# Patient Record
Sex: Male | Born: 1995 | Race: White | Hispanic: No | Marital: Single | Smoking: Never smoker
Health system: Southern US, Community
[De-identification: ages and names within clinical notes are randomized; demographics above are authoritative.]

## PROBLEM LIST (undated history)

## (undated) DIAGNOSIS — G40909 Epilepsy, unspecified, not intractable, without status epilepticus: Secondary | ICD-10-CM

---
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
## 2014-08-19 ENCOUNTER — Emergency Department (INDEPENDENT_AMBULATORY_CARE_PROVIDER_SITE_OTHER): Payer: BLUE CROSS/BLUE SHIELD

## 2014-08-19 ENCOUNTER — Encounter: Payer: Self-pay | Admitting: Emergency Medicine

## 2014-08-19 ENCOUNTER — Emergency Department
Admission: EM | Admit: 2014-08-19 | Discharge: 2014-08-19 | Disposition: A | Discharge status: Home / Self Care | Payer: BLUE CROSS/BLUE SHIELD | Source: Home / Self Care | Attending: Emergency Medicine | Admitting: Emergency Medicine

## 2014-08-19 DIAGNOSIS — S63502A Unspecified sprain of left wrist, initial encounter: Secondary | ICD-10-CM | POA: Diagnosis not present

## 2014-08-19 DIAGNOSIS — M25532 Pain in left wrist: Secondary | ICD-10-CM | POA: Diagnosis not present

## 2014-08-19 HISTORY — DX: Epilepsy, unspecified, not intractable, without status epilepticus: G40.909

## 2014-08-19 NOTE — Discharge Instructions (Signed)
Wrist Sprain  with Rehab  A sprain is an injury in which a ligament that maintains the proper alignment of a joint is partially or completely torn. The ligaments of the wrist are susceptible to sprains. Sprains are classified into three categories. Grade 1 sprains cause pain, but the tendon is not lengthened. Grade 2 sprains include a lengthened ligament because the ligament is stretched or partially ruptured. With grade 2 sprains there is still function, although the function may be diminished. Grade 3 sprains are characterized by a complete tear of the tendon or muscle, and function is usually impaired.  SYMPTOMS   · Pain tenderness, inflammation, and/or bruising (contusion) of the injury.  · A "pop" or tear felt and/or heard at the time of injury.  · Decreased wrist function.  CAUSES   A wrist sprain occurs when a force is placed on one or more ligaments that is greater than it/they can withstand. Common mechanisms of injury include:  · Catching a ball with you hands.  · Repetitive and/ or strenuous extension or flexion of the wrist.  RISK INCREASES WITH:  · Previous wrist injury.  · Contact sports (boxing or wrestling).  · Activities in which falling is common.  · Poor strength and flexibility.  · Improperly fitted or padded protective equipment.  PREVENTION  · Warm up and stretch properly before activity.  · Allow for adequate recovery between workouts.  · Maintain physical fitness:  ¨ Strength, flexibility, and endurance.  ¨ Cardiovascular fitness.  · Protect the wrist joint by limiting its motion with the use of taping, braces, or splints.  · Protect the wrist after injury for 6 to 12 months.  PROGNOSIS   The prognosis for wrist sprains depends on the degree of injury. Grade 1 sprains require 2 to 6 weeks of treatment. Grade 2 sprains require 6 to 8 weeks of treatment, and grade 3 sprains require up to 12 weeks.   RELATED COMPLICATIONS   · Prolonged healing time, if improperly treated or  re-injured.  · Recurrent symptoms that result in a chronic problem.  · Injury to nearby structures (bone, cartilage, nerves, or tendons).  · Arthritis of the wrist.  · Inability to compete in athletics at a high level.  · Wrist stiffness or weakness.  · Progression to a complete rupture of the ligament.  TREATMENT   Treatment initially involves resting from any activities that aggravate the symptoms, and the use of ice and medications to help reduce pain and inflammation. Your caregiver may recommend immobilizing the wrist for a period of time in order to reduce stress on the ligament and allow for healing. After immobilization it is important to perform strengthening and stretching exercises to help regain strength and a full range of motion. These exercises may be completed at home or with a therapist. Surgery is not usually required for wrist sprains, unless the ligament has been ruptured (grade 3 sprain).  MEDICATION   · If pain medication is necessary, then nonsteroidal anti-inflammatory medications, such as aspirin and ibuprofen, or other minor pain relievers, such as acetaminophen, are often recommended.  · Do not take pain medication for 7 days before surgery.  · Prescription pain relievers may be given if deemed necessary by your caregiver. Use only as directed and only as much as you need.  HEAT AND COLD  · Cold treatment (icing) relieves pain and reduces inflammation. Cold treatment should be applied for 10 to 15 minutes every 2 to 3 hours for inflammation   and pain and immediately after any activity that aggravates your symptoms. Use ice packs or massage the area with a piece of ice (ice massage).  · Heat treatment may be used prior to performing the stretching and strengthening activities prescribed by your caregiver, physical therapist, or athletic trainer. Use a heat pack or soak your injury in warm water.  SEEK MEDICAL CARE IF:  · Treatment seems to offer no benefit, or the condition worsens.  · Any  medications produce adverse side effects.  EXERCISES  RANGE OF MOTION (ROM) AND STRETCHING EXERCISES - Wrist Sprain   These exercises may help you when beginning to rehabilitate your injury. Your symptoms may resolve with or without further involvement from your physician, physical therapist or athletic trainer. While completing these exercises, remember:   · Restoring tissue flexibility helps normal motion to return to the joints. This allows healthier, less painful movement and activity.  · An effective stretch should be held for at least 30 seconds.  · A stretch should never be painful. You should only feel a gentle lengthening or release in the stretched tissue.  RANGE OF MOTION - Wrist Flexion, Active-Assisted  · Extend your right / left elbow with your fingers pointing down.*  · Gently pull the back of your hand towards you until you feel a gentle stretch on the top of your forearm.  · Hold this position for __________ seconds.  Repeat __________ times. Complete this exercise __________ times per day.   *If directed by your physician, physical therapist or athletic trainer, complete this stretch with your elbow bent rather than extended.  RANGE OF MOTION - Wrist Extension, Active-Assisted  · Extend your right / left elbow and turn your palm upwards.*  · Gently pull your palm/fingertips back so your wrist extends and your fingers point more toward the ground.  · You should feel a gentle stretch on the inside of your forearm.  · Hold this position for __________ seconds.  Repeat __________ times. Complete this exercise __________ times per day.  *If directed by your physician, physical therapist or athletic trainer, complete this stretch with your elbow bent, rather than extended.  RANGE OF MOTION - Supination, Active  · Stand or sit with your elbows at your side. Bend your right / left elbow to 90 degrees.  · Turn your palm upward until you feel a gentle stretch on the inside of your forearm.  · Hold this  position for __________ seconds. Slowly release and return to the starting position.  Repeat __________ times. Complete this stretch __________ times per day.   RANGE OF MOTION - Pronation, Active  · Stand or sit with your elbows at your side. Bend your right / left elbow to 90 degrees.  · Turn your palm downward until you feel a gentle stretch on the top of your forearm.  · Hold this position for __________ seconds. Slowly release and return to the starting position.  Repeat __________ times. Complete this stretch __________ times per day.   STRETCH - Wrist Flexion  · Place the back of your right / left hand on a tabletop leaving your elbow slightly bent. Your fingers should point away from your body.  · Gently press the back of your hand down onto the table by straightening your elbow. You should feel a stretch on the top of your forearm.  · Hold this position for __________ seconds.  Repeat __________ times. Complete this stretch __________ times per day.   STRETCH - Wrist   Extension  · Place your right / left fingertips on a tabletop leaving your elbow slightly bent. Your fingers should point backwards.  · Gently press your fingers and palm down onto the table by straightening your elbow. You should feel a stretch on the inside of your forearm.  · Hold this position for __________ seconds.  Repeat __________ times. Complete this stretch __________ times per day.   STRENGTHENING EXERCISES - Wrist Sprain  These exercises may help you when beginning to rehabilitate your injury. They may resolve your symptoms with or without further involvement from your physician, physical therapist or athletic trainer. While completing these exercises, remember:   · Muscles can gain both the endurance and the strength needed for everyday activities through controlled exercises.  · Complete these exercises as instructed by your physician, physical therapist or athletic trainer. Progress with the resistance and repetition exercises  only as your caregiver advises.  STRENGTH - Wrist Flexors  · Sit with your right / left forearm palm-up and fully supported. Your elbow should be resting below the height of your shoulder. Allow your wrist to extend over the edge of the surface.  · Loosely holding a __________ weight or a piece of rubber exercise band/tubing, slowly curl your hand up toward your forearm.  · Hold this position for __________ seconds. Slowly lower the wrist back to the starting position in a controlled manner.  Repeat __________ times. Complete this exercise __________ times per day.   STRENGTH - Wrist Extensors  · Sit with your right / left forearm palm-down and fully supported. Your elbow should be resting below the height of your shoulder. Allow your wrist to extend over the edge of the surface.  · Loosely holding a __________ weight or a piece of rubber exercise band/tubing, slowly curl your hand up toward your forearm.  · Hold this position for __________ seconds. Slowly lower the wrist back to the starting position in a controlled manner.  Repeat __________ times. Complete this exercise __________ times per day.   STRENGTH - Ulnar Deviators  · Stand with a ____________________ weight in your right / left hand, or sit holding on to the rubber exercise band/tubing with your opposite arm supported.  · Move your wrist so that your pinkie travels toward your forearm and your thumb moves away from your forearm.  · Hold this position for __________ seconds and then slowly lower the wrist back to the starting position.  Repeat __________ times. Complete this exercise __________ times per day  STRENGTH - Radial Deviators  · Stand with a ____________________ weight in your  · right / left hand, or sit holding on to the rubber exercise band/tubing with your arm supported.  · Raise your hand upward in front of you or pull up on the rubber tubing.  · Hold this position for __________ seconds and then slowly lower the wrist back to the  starting position.  Repeat __________ times. Complete this exercise __________ times per day.  STRENGTH - Forearm Supinators  · Sit with your right / left forearm supported on a table, keeping your elbow below shoulder height. Rest your hand over the edge, palm down.  · Gently grip a hammer or a soup ladle.  · Without moving your elbow, slowly turn your palm and hand upward to a "thumbs-up" position.  · Hold this position for __________ seconds. Slowly return to the starting position.  Repeat __________ times. Complete this exercise __________ times per day.   STRENGTH - Forearm   Pronators  · Sit with your right / left forearm supported on a table, keeping your elbow below shoulder height. Rest your hand over the edge, palm up.  · Gently grip a hammer or a soup ladle.  · Without moving your elbow, slowly turn your palm and hand upward to a "thumbs-up" position.  · Hold this position for __________ seconds. Slowly return to the starting position.  Repeat __________ times. Complete this exercise __________ times per day.   STRENGTH - Grip  · Grasp a tennis ball, a dense sponge, or a large, rolled sock in your hand.  · Squeeze as hard as you can without increasing any pain.  · Hold this position for __________ seconds. Release your grip slowly.  Repeat __________ times. Complete this exercise __________ times per day.   Document Released: 01/16/2005 Document Revised: 04/10/2011 Document Reviewed: 04/30/2008  ExitCare® Patient Information ©2015 [employer], LLC. This information is not intended to replace advice given to you by your health care provider. Make sure you discuss any questions you have with your health care provider.

## 2014-08-19 NOTE — ED Notes (Signed)
Left wrist injury last night slid on it playing baseball

## 2014-08-19 NOTE — ED Notes (Signed)
Per Dr. Rosanne AshingMassey's request, Pt apt with Dr. Denyse Amassorey made for 08/20/14 @ 145. Apt time and location given to patient while in office.

## 2014-08-19 NOTE — ED Provider Notes (Signed)
CSN: [id number]     Arrival date & time 08/19/14  0912 History   First MD Initiated Contact with Patient 08/19/14 [phone]     Chief Complaint  Patient presents with  . Wrist Injury    HPI Injured left wrist last night while playing baseball. He slid into home headfirst landing on left wrist. Complains of severe left lateral wrist pain over left distal ulna. He felt a pop at the time of injury. Pain is 3 out of 10 at rest, 7 out of 10 with movement. No definite numbness or weakness. No prior injury. The pain was severe enough that he had to come out of the game at the time. He has iced it and kept it elevated. Associated symptoms: No nausea or vomiting or cardiorespiratory symptoms. No elbow pain. No head or neck symptoms. No focal neurologic symptoms Past Medical History  Diagnosis Date  . Epilepsy    History reviewed. No pertinent past surgical history. No family history on file. History  Substance Use Topics  . Smoking status: Never Smoker   . Smokeless tobacco: Never Used  . Alcohol Use: No    Review of Systems Remainder of Review of Systems negative for acute change except as noted in the HPI. No seizures. No loc. Allergies  Sulfonamide derivatives  Home Medications   Prior to Admission medications   Medication Sig Start Date End Date Taking? Authorizing Provider  cetirizine (ZYRTEC) 10 MG tablet Take 10 mg by mouth daily.   Yes Historical Provider, MD  ciprofloxacin (CILOXAN) 0.3 % ophthalmic solution Place 1 drop into the right eye every 4 (four) hours while awake. Administer 1 drop, every 2 hours, while awake, for 2 days. Then 1 drop, every 4 hours, while awake, for the next 5 days. 12/28/13   Rodolph BongEvan S Corey, MD  levETIRAcetam (KEPPRA) 500 MG tablet Take 500 mg by mouth 2 (two) times daily.    Historical Provider, MD   BP 137/75 mmHg  Pulse 55  Temp(Src) 98.3 F (36.8 C) (Oral)  Ht 5\' 10"  (1.778 m)  Wt 163 lb (73.936 kg)  BMI 23.39 kg/m2  SpO2 99% Physical Exam   Constitutional: He is oriented to person, place, and time. He appears well-developed and well-nourished. No distress.  Uncomfortable from left wrist pain.  HENT:  Head: Normocephalic and atraumatic.  Eyes: Conjunctivae and EOM are normal. Pupils are equal, round, and reactive to light. No scleral icterus.  Neck: Normal range of motion.  Cardiovascular: Normal rate.   Pulmonary/Chest: Effort normal.  Abdominal: He exhibits no distension.  Musculoskeletal:       Left shoulder: He exhibits no tenderness.       Left elbow: No tenderness found.       Left wrist: He exhibits decreased range of motion, tenderness and bony tenderness.       Cervical back: He exhibits no tenderness.  Tenderness and swelling and decreased range of motion left distal ulna. Pain especially exacerbated by flexion of wrist. Tendons and strength intact. Skin intact. No ecchymosis. Sensation and neurovascular distally intact .capillary refill intact.  Neurological: He is alert and oriented to person, place, and time.  Skin: Skin is warm.  Psychiatric: He has a normal mood and affect.  Nursing note and vitals reviewed.   ED Course  Procedures (including critical care time) Labs Review Labs Reviewed - No data to display  Imaging Review Dg Wrist Complete Left  08/19/2014   CLINICAL DATA:  Pain after injury on sliding board  EXAM: LEFT WRIST - COMPLETE 3+ VIEW  COMPARISON:  None.  FINDINGS: Frontal, oblique, lateral, and ulnar deviation scaphoid images obtained. There is no fracture or dislocation. Joint spaces appear intact. No erosive change.  IMPRESSION: No fracture or dislocation.  No appreciable arthropathic change.   Electronically Signed   By: Bretta Bang III M.D.   On: 08/19/2014 09:48     MDM   1. Left wrist sprain, initial encounter    Treatment options discussed, as well as risks, benefits, alternatives. Patient voiced understanding and agreement with the following plans: Encourage rest, ice,  compression with ACE bandage, and elevation of injured body part. Splint applied. Note written for no sports or baseball for 7/20 and 7/21. Appointment made for sports medicine consultation with Dr. Denyse Amass on 7/21. He declined any prescription pain medication. He prefers to use ibuprofen OTC. Precautions discussed. Red flags discussed. Questions invited and answered. Patient voiced understanding and agreement. An After Visit Summary was printed and given to the patient.     Lajean Manes, MD 08/19/14 1029

## 2014-08-20 ENCOUNTER — Encounter: Payer: Self-pay | Admitting: Family Medicine

## 2014-08-20 ENCOUNTER — Ambulatory Visit (INDEPENDENT_AMBULATORY_CARE_PROVIDER_SITE_OTHER): Payer: BLUE CROSS/BLUE SHIELD | Admitting: Family Medicine

## 2014-08-20 VITALS — BP 127/68 | HR 61 | Wt 167.0 lb

## 2014-08-20 DIAGNOSIS — S63502A Unspecified sprain of left wrist, initial encounter: Secondary | ICD-10-CM | POA: Insufficient documentation

## 2014-08-20 NOTE — Assessment & Plan Note (Signed)
Likely simple left wrist sprain. However he may have an occult TFCC injury. Plan for watchful waiting for one week with bracing. Return in one week. If having pain at that time will repeat x-rays and consider MRI arthrogram of the left wrist to evaluate the TFCC.

## 2014-08-20 NOTE — Progress Notes (Signed)
   Subjective:    I'm seeing this patient as a consultation for:  Dr. Georgina Pillion  CC: Left wrist pain  HPI: Patient was playing baseball and slid in to the base injuring his left hand and wrist. He felt a pop in the ulnar aspect of his left wrist. He notes pain and swelling. He was seen in urgent care where x-rays were negative. He was treated with a thumb spica splint and sent to for referral today. He was evaluated yesterday. He has tried some over-the-counter medicines for pain which have helped. No radiating pain weakness or numbness fevers or chills.  Past medical history, Surgical history, Family history not pertinant except as noted below, Social history, Allergies, and medications have been entered into the medical record, reviewed, and no changes needed.   Review of Systems: No headache, visual changes, nausea, vomiting, diarrhea, constipation, dizziness, abdominal pain, skin rash, fevers, chills, night sweats, weight loss, swollen lymph nodes, body aches, joint swelling, muscle aches, chest pain, shortness of breath, mood changes, visual or auditory hallucinations.   Objective:   General: Well Developed, well nourished, and in no acute distress.  Neuro/Psych: Alert and oriented x3, extra-ocular muscles intact, able to move all 4 extremities, sensation grossly intact. Skin: Warm and dry, no rashes noted.  Respiratory: Not using accessory muscles, speaking in full sentences, trachea midline.  Cardiovascular: Pulses palpable, no extremity edema. Abdomen: Does not appear distended. MSK: Left wrists normal-appearing. Mildly tender left lateral wrist in the TFCC area. Normal motion and strength. Pulses capillary refill sensation intact.  No results found for this or any previous visit (from the past 24 hour(s)). Dg Wrist Complete Left  08/19/2014   CLINICAL DATA:  Pain after injury on sliding board  EXAM: LEFT WRIST - COMPLETE 3+ VIEW  COMPARISON:  None.  FINDINGS: Frontal, oblique, lateral,  and ulnar deviation scaphoid images obtained. There is no fracture or dislocation. Joint spaces appear intact. No erosive change.  IMPRESSION: No fracture or dislocation.  No appreciable arthropathic change.   Electronically Signed   By: Bretta Bang III M.D.   On: 08/19/2014 09:48    Impression and Recommendations:   This case required medical decision making of moderate complexity.

## 2014-08-20 NOTE — Patient Instructions (Signed)
Thank you for coming in today. Use the brace.  Return in 1 week if not completely better.   I am worried about a TFCC tear.

## 2014-08-26 ENCOUNTER — Ambulatory Visit (INDEPENDENT_AMBULATORY_CARE_PROVIDER_SITE_OTHER): Payer: BLUE CROSS/BLUE SHIELD | Admitting: Family Medicine

## 2014-08-26 ENCOUNTER — Encounter: Payer: Self-pay | Admitting: Family Medicine

## 2014-08-26 VITALS — BP 128/76 | HR 92 | Wt 165.0 lb

## 2014-08-26 DIAGNOSIS — S63502D Unspecified sprain of left wrist, subsequent encounter: Secondary | ICD-10-CM

## 2014-08-26 DIAGNOSIS — M25532 Pain in left wrist: Secondary | ICD-10-CM

## 2014-08-26 NOTE — Progress Notes (Signed)
Mitchell Ellis is a 19 y.o. male who presents to [facility]  today for follow-up wrist pain. Patient was seen on the 21st for a left wrist injury. He was treated conservatively with wrist bracing. He notes the pain is somewhat improved but he continues to have ulnar sided popping and pain with wrist motion. He denies any radiating pain weakness or numbness fevers or chills.   Past Medical History  Diagnosis Date  . Epilepsy    No past surgical history on file. History  Substance Use Topics  . Smoking status: Never Smoker   . Smokeless tobacco: Never Used  . Alcohol Use: No   ROS as above Medications: Current Outpatient Prescriptions  Medication Sig Dispense Refill  . cetirizine (ZYRTEC) 10 MG tablet Take 10 mg by mouth daily.    Marland Kitchen levETIRAcetam (KEPPRA) 500 MG tablet Take 500 mg by mouth 2 (two) times daily.     No current facility-administered medications for this visit.   Allergies  Allergen Reactions  . Sulfonamide Derivatives     REACTION: Rash     Exam:  BP 128/76 mmHg  Pulse 92  Wt 165 lb (74.844 kg) Gen: Well NAD Left wrist normal-appearing mildly tender distal ulnar wrist. Palpable and audible pop with wrist motion. Capillary refill pulses and sensation intact distally. Exts: Brisk capillary refill, warm and well perfused.   Limited musculoskeletal ultrasound of the left wrist ulnar side shows an intact appearing TFCC with no obvious tears. No joint effusion visible.  No results found for this or any previous visit (from the past 24 hour(s)). No results found.   Please see individual assessment and plan sections.

## 2014-08-26 NOTE — Assessment & Plan Note (Signed)
Continued pain. Concerning for TFCC tear. X-ray was normal. Plan for physical therapy. If not better will proceed with MRI arthrogram

## 2014-08-26 NOTE — Patient Instructions (Signed)
Thank you for coming in today.  Attend Physical therapy.  Let me know in 2-4 weeks if you are not better.  If so we will arrange for a MRI Arthrogram of the left wrist.

## 2014-09-04 ENCOUNTER — Encounter: Payer: Self-pay | Admitting: Family Medicine

## 2014-09-04 ENCOUNTER — Ambulatory Visit (INDEPENDENT_AMBULATORY_CARE_PROVIDER_SITE_OTHER): Payer: BLUE CROSS/BLUE SHIELD | Admitting: Family Medicine

## 2014-09-04 VITALS — BP 129/82 | HR 74 | Wt 166.0 lb

## 2014-09-04 DIAGNOSIS — M25532 Pain in left wrist: Secondary | ICD-10-CM | POA: Diagnosis not present

## 2014-09-04 NOTE — Progress Notes (Signed)
Mitchell Ellis is a 19 y.o. male who presents to [facility]  today for left wrist. Patient has continued left wrist pain and clicking. Pain is present ulnarly. This is persistent despite rest bracing NSAIDs and physical therapy. At this point the symptoms limit his ability to lift weights and do normal activities.   Past Medical History  Diagnosis Date  . Epilepsy    No past surgical history on file. History  Substance Use Topics  . Smoking status: Never Smoker   . Smokeless tobacco: Never Used  . Alcohol Use: No   ROS as above Medications: Current Outpatient Prescriptions  Medication Sig Dispense Refill  . cetirizine (ZYRTEC) 10 MG tablet Take 10 mg by mouth daily.    Marland Kitchen levETIRAcetam (KEPPRA) 500 MG tablet Take 500 mg by mouth 2 (two) times daily.     No current facility-administered medications for this visit.   Allergies  Allergen Reactions  . Sulfonamide Derivatives     REACTION: Rash     Exam:  BP 129/82 mmHg  Pulse 74  Wt 166 lb (75.297 kg) Gen: Well NAD Palpable click in the ulnar aspect of the left wrist with motion.  Intact Strength pulses and capillary refill Exts: Brisk capillary refill, warm and well perfused.   No results found for this or any previous visit (from the past 24 hour(s)). No results found.   Please see individual assessment and plan sections.

## 2014-09-04 NOTE — Assessment & Plan Note (Addendum)
Failed conservative therapy. Obtain MRI arthrogram to evaluate TFCC strain or tear

## 2014-09-04 NOTE — Patient Instructions (Signed)
Thank you for coming in today. See me 1 hour prior to the MRI. Make sure to schedule.

## 2014-09-07 ENCOUNTER — Ambulatory Visit (INDEPENDENT_AMBULATORY_CARE_PROVIDER_SITE_OTHER): Payer: BLUE CROSS/BLUE SHIELD | Admitting: Family Medicine

## 2014-09-07 ENCOUNTER — Telehealth: Payer: Self-pay | Admitting: Family Medicine

## 2014-09-07 ENCOUNTER — Encounter: Payer: Self-pay | Admitting: Family Medicine

## 2014-09-07 ENCOUNTER — Ambulatory Visit (INDEPENDENT_AMBULATORY_CARE_PROVIDER_SITE_OTHER): Payer: BLUE CROSS/BLUE SHIELD

## 2014-09-07 VITALS — BP 125/74 | HR 75 | Wt 163.0 lb

## 2014-09-07 DIAGNOSIS — X58XXXA Exposure to other specified factors, initial encounter: Secondary | ICD-10-CM | POA: Diagnosis not present

## 2014-09-07 DIAGNOSIS — M25532 Pain in left wrist: Secondary | ICD-10-CM

## 2014-09-07 DIAGNOSIS — S62112A Displaced fracture of triquetrum [cuneiform] bone, left wrist, initial encounter for closed fracture: Secondary | ICD-10-CM | POA: Insufficient documentation

## 2014-09-07 NOTE — Progress Notes (Signed)
Patient returns to clinic today after MRI of his wrist showed a triquetrum fracture. A well fitting short-arm cast was applied

## 2014-09-07 NOTE — Progress Notes (Signed)
Procedure: Real-time Ultrasound Guided gadolinium contrast injection of left wirist Device: GE Logiq E  Verbal informed consent obtained.  Time-out conducted.  Noted no overlying erythema, induration, or other signs of local infection.  Skin prepped in a sterile fashion.  Local anesthesia: Topical Ethyl chloride.  With sterile technique and under real time ultrasound guidance:   kenalog, 2ml marcaine, 0.56ml gadolinium contrast agent, and 2 mL of sterile saline injected Joint visualized and capsule seen distending confirming intra-articular placement of contrast material and medication. Completed without difficulty  Advised to call if fevers/chills, erythema, induration, drainage, or persistent bleeding.  Images permanently stored and available for review in the ultrasound unit.  Impression: Technically successful ultrasound guided gadolinium contrast injection for MR arthrography.  Please see separate MR arthrogram report.

## 2014-09-07 NOTE — Telephone Encounter (Signed)
MRI results back. I left a message asking patient to come back.

## 2014-09-08 ENCOUNTER — Emergency Department (INDEPENDENT_AMBULATORY_CARE_PROVIDER_SITE_OTHER)
Admission: EM | Admit: 2014-09-08 | Discharge: 2014-09-08 | Disposition: A | Discharge status: Home / Self Care | Payer: BLUE CROSS/BLUE SHIELD | Source: Home / Self Care | Attending: Family Medicine | Admitting: Family Medicine

## 2014-09-08 ENCOUNTER — Encounter: Payer: Self-pay | Admitting: Family Medicine

## 2014-09-08 ENCOUNTER — Ambulatory Visit (INDEPENDENT_AMBULATORY_CARE_PROVIDER_SITE_OTHER): Payer: BLUE CROSS/BLUE SHIELD | Admitting: Family Medicine

## 2014-09-08 VITALS — BP 134/81 | HR 59 | Wt 163.0 lb

## 2014-09-08 DIAGNOSIS — Z4789 Encounter for other orthopedic aftercare: Secondary | ICD-10-CM

## 2014-09-08 DIAGNOSIS — R202 Paresthesia of skin: Secondary | ICD-10-CM

## 2014-09-08 DIAGNOSIS — F419 Anxiety disorder, unspecified: Secondary | ICD-10-CM

## 2014-09-08 NOTE — Progress Notes (Signed)
  Patient returns to clinic today for cast change. He got his cast completely soaking wet twice since it  Was placed yesterday.  A new left short arm cast was applied. The skin was well appearing without significant maceration.

## 2014-09-08 NOTE — ED Notes (Signed)
Pt c/o intermittent tingling sensation over entire body x2 days. States he has recently stopped taking amitriptyline.

## 2014-09-08 NOTE — ED Provider Notes (Signed)
CSN: [id number]     Arrival date & time 09/08/14  1156 History   First MD Initiated Contact with Patient 09/08/14 1208     Chief Complaint  Patient presents with  . Tingling   (Consider location/radiation/quality/duration/timing/severity/associated sxs/prior Treatment) HPI The patient is a 19 year old male with history of epilepsy presenting to urgent care with complaints of intermittent tingling sensation all over his body for the last 2 days.  Patient states he started taking amitriptyline for depression at the beginning of July.  He was then seen by a provider at [facility] who stated he no longer needed to be on amitriptyline, so patient stopped the medication 5 days ago.  Patient stated he did feel tired and nauseated while taking the medication.  However, since stopping.  He has felt like "things are moving very fast."  Patient also states he feels like his heart is racing at times and he feels very anxious.  Patient states he feels very anxious around people.  He also reports numbness and tingling in his hands and feet.  She has not tried anything for his symptoms.  Patient states he does take Keppra daily and has been taking this since he was a teenager.  States his last seizure was when he was 19 years old.  Denies headache, chest pain or difficulty breathing.  Denies any other drug use.  States he has never been on any antianxiety medications.  Patient does question whether he should be tested for HIV stating he did have some discomfort in his scrotum 2 days ago for a few minutes but that resolved.  He denies pain or difficulty urinating.  Denies penile discharge or blood in urine.  Patient does report unprotected sex, but denies concern for other STDs at this time.  Denies IV drug use.  Patient states he did call his provider at [facility] today and was advised to follow-up by next week if symptoms not improving. Denies SI/HI.  Past Medical History  Diagnosis Date  . Epilepsy    No past  surgical history on file. No family history on file. History  Substance Use Topics  . Smoking status: Never Smoker   . Smokeless tobacco: Never Used  . Alcohol Use: No    Review of Systems  Constitutional: Negative for fever and chills.  HENT: Negative for congestion, ear pain, sore throat, trouble swallowing and voice change.   Respiratory: Negative for cough and shortness of breath.   Cardiovascular: Positive for palpitations. Negative for chest pain.  Gastrointestinal: Negative for nausea, vomiting, abdominal pain and diarrhea.  Musculoskeletal: Positive for myalgias and arthralgias. Negative for back pain.  Skin: Negative for rash.  Neurological: Positive for numbness ( "tingling"). Negative for tremors, syncope, weakness and headaches.  Psychiatric/Behavioral: The patient is nervous/anxious ( anxious).   All other systems reviewed and are negative.   Allergies  Sulfonamide derivatives  Home Medications   Prior to Admission medications   Medication Sig Start Date End Date Taking? Authorizing Provider  cetirizine (ZYRTEC) 10 MG tablet Take 10 mg by mouth daily.    Historical Provider, MD  levETIRAcetam (KEPPRA) 500 MG tablet Take 500 mg by mouth 2 (two) times daily.    Historical Provider, MD   BP 148/80 mmHg  Pulse 60  Temp(Src) 98.6 F (37 C) (Oral)  SpO2 100% Physical Exam  Constitutional: He appears well-developed and well-nourished.  HENT:  Head: Normocephalic and atraumatic.  Right Ear: Hearing, tympanic membrane, external ear and ear canal normal.  Left Ear: Hearing, tympanic membrane, external ear and ear canal normal.  Nose: Nose normal.  Mouth/Throat: Uvula is midline, oropharynx is clear and moist and mucous membranes are normal.  Eyes: Conjunctivae and EOM are normal. Pupils are equal, round, and reactive to light. No scleral icterus.  Neck: Normal range of motion. Neck supple.  Cardiovascular: Normal rate, regular rhythm and normal heart sounds.    Pulmonary/Chest: Effort normal and breath sounds normal. No respiratory distress. He has no wheezes. He has no rales. He exhibits no tenderness.  Abdominal: Soft. Bowel sounds are normal. He exhibits no distension and no mass. There is no tenderness. There is no rebound and no guarding.  Musculoskeletal: Normal range of motion.  Neurological: He is alert.  Skin: Skin is warm and dry.  Psychiatric: His speech is normal. Thought content normal. His mood appears anxious. He is hyperactive.  Nursing note and vitals reviewed.   ED Course  Procedures (including critical care time) Labs Review Labs Reviewed - No data to display  Imaging Review Mr Wrist Left W Contrast  09/07/2014   CLINICAL DATA:  Basketball injury July 21st. LEFT wrist pain. Popping at the ulnar side of the joint.  EXAM: MRI OF THE LEFT WRIST WITH CONTRAST(MR Arthrogram)  TECHNIQUE: Multiplanar, multisequence MR imaging of the wrist was performed immediately following contrast injection into the radiocarpal joint under fluoroscopic guidance. No intravenous contrast was administered.  COMPARISON:  Radiograph 08/19/2014.  FINDINGS: Scapholunate and lunotriquetral ligaments are intact. There is bone marrow edema in the triquetrum compatible with an impaction fracture. The piece form bone appears normal. Scaphoid and lunate bones both appear normal.  The triangular fibrocartilage is intact. There is no extravasation of contrast from the radiocarpal joint into the distal radioulnar joint. The flexor tendons are normal. Extensor tendons are also normal. Subcutaneous edema is present in the volar aspect of the wrist. There is a small ganglion on the dorsal aspect of the wrist dorsal to the STT joint that measures 6 mm on axial imaging and tracks to the ulnar side of the dorsal lunate bone. This does not fill with contrast.  Radiocarpal articular cartilage appears normal. Chondromalacia associated with impaction fracture of the triquetrum noted.   IMPRESSION: 1. Impaction fracture of the triquetrum. 2. Intact scapholunate and lunotriquetral ligaments.  Intact TFCC. 3. Small ganglion cyst dorsal to the STT joint.   Electronically Signed   By: Andreas Newport M.D.   On: 09/07/2014 10:53     MDM   1. Tingling in extremities   2. Anxiety     Patient is a 19 year old male presenting to urgent care with complaints of diffuse tingling as well as anxiety.  Patient was recently started on amitriptyline last month and discontinued the medication per advice by his medical provider at [facility] 5 days ago.  He denies drug use.  Patient randomly questioned whether he should be tested for HIV, however, does not have any significant risk factors.  Advised patient we could draw blood, which would result in 2-3 days.  Patient declined stating he would like to try to get the current medication out of his system to see if he feels any better and will follow-up with his provider at [facility] to determine if he does need STD testing. Reassured patient that HIV does not typically present with anxiety unless known exposure, which could trigger anxiety. Offered pt 6 tabs of ativan to help calm him down over the next few days until he can f/u  with his medical provide [facility], pt declined. Discussed various ways pt could help improve his anxiety at home including avoiding caffeine (coffee, tea, soda, and chocolate), suggested exercising such as jogging or running, and taking a warm bath or shower. Pt education packet about anxiety provided to pt. Encouraged to keep in touch with his providers at [facility] to see if he can be fit in sooner than next week. Patient verbalized understanding and agreement with treatment plan.     Junius Finner, PA-C 09/08/14 1401

## 2014-10-23 ENCOUNTER — Ambulatory Visit (INDEPENDENT_AMBULATORY_CARE_PROVIDER_SITE_OTHER): Payer: BLUE CROSS/BLUE SHIELD | Admitting: Family Medicine

## 2014-10-23 ENCOUNTER — Ambulatory Visit (INDEPENDENT_AMBULATORY_CARE_PROVIDER_SITE_OTHER): Payer: BLUE CROSS/BLUE SHIELD

## 2014-10-23 ENCOUNTER — Encounter: Payer: Self-pay | Admitting: Family Medicine

## 2014-10-23 VITALS — BP 120/72 | HR 62 | Wt 170.0 lb

## 2014-10-23 DIAGNOSIS — X58XXXD Exposure to other specified factors, subsequent encounter: Secondary | ICD-10-CM

## 2014-10-23 DIAGNOSIS — S62112A Displaced fracture of triquetrum [cuneiform] bone, left wrist, initial encounter for closed fracture: Secondary | ICD-10-CM

## 2014-10-23 DIAGNOSIS — S62112G Displaced fracture of triquetrum [cuneiform] bone, left wrist, subsequent encounter for fracture with delayed healing: Secondary | ICD-10-CM

## 2014-10-23 DIAGNOSIS — S62112D Displaced fracture of triquetrum [cuneiform] bone, left wrist, subsequent encounter for fracture with routine healing: Secondary | ICD-10-CM | POA: Diagnosis not present

## 2014-10-23 NOTE — Patient Instructions (Signed)
Thank you for coming in today. Return in 2 weeks.   

## 2014-10-23 NOTE — Progress Notes (Signed)
Mitchell Ellis is a 19 y.o. male who presents to [facility]  today for follow-up fracture. Patient was last seen about 6 weeks ago on August 8th. At that time he had already been treated for some time for a would eventually was diagnosed as a impacted fracture triquetrum.  Unfortunately had to leave town to attend school in [place].  Unfortunately he never followed up with orthopedics in [place] and he's had the same cast on for 6 weeks. He never called for clarification. He feels well with no significant hand or wrist pain.   Past Medical History  Diagnosis Date  . Epilepsy    No past surgical history on file. Social History  Substance Use Topics  . Smoking status: Never Smoker   . Smokeless tobacco: Never Used  . Alcohol Use: No   family history is not on file.  ROS as above Medications: Current Outpatient Prescriptions  Medication Sig Dispense Refill  . cetirizine (ZYRTEC) 10 MG tablet Take 10 mg by mouth daily.    Marland Kitchen levETIRAcetam (KEPPRA) 500 MG tablet Take 500 mg by mouth 2 (two) times daily.     No current facility-administered medications for this visit.   Allergies  Allergen Reactions  . Sulfonamide Derivatives     REACTION: Rash     Exam:  BP 120/72 mmHg  Pulse 62  Wt 170 lb (77.111 kg) Gen: Well NAD Left wrist well-appearing no maceration. Nontender no swelling. Slight decreased motion.   Preliminary x-ray shows a not fully healed fracture likely of the triquetrum. Awaiting formal radiology read. No results found for this or any previous visit (from the past 24 hour(s)). No results found.   Please see individual assessment and plan sections.

## 2014-10-23 NOTE — Assessment & Plan Note (Signed)
Exos cast fitted return in 2 weeks

## 2014-11-06 ENCOUNTER — Ambulatory Visit (INDEPENDENT_AMBULATORY_CARE_PROVIDER_SITE_OTHER): Payer: BLUE CROSS/BLUE SHIELD | Admitting: Family Medicine

## 2014-11-06 ENCOUNTER — Ambulatory Visit (INDEPENDENT_AMBULATORY_CARE_PROVIDER_SITE_OTHER): Payer: BLUE CROSS/BLUE SHIELD

## 2014-11-06 ENCOUNTER — Encounter: Payer: Self-pay | Admitting: Family Medicine

## 2014-11-06 VITALS — BP 111/61 | HR 62 | Temp 97.7°F | Resp 16

## 2014-11-06 DIAGNOSIS — Z23 Encounter for immunization: Secondary | ICD-10-CM

## 2014-11-06 DIAGNOSIS — S62112D Displaced fracture of triquetrum [cuneiform] bone, left wrist, subsequent encounter for fracture with routine healing: Secondary | ICD-10-CM

## 2014-11-06 DIAGNOSIS — X58XXXD Exposure to other specified factors, subsequent encounter: Secondary | ICD-10-CM | POA: Diagnosis not present

## 2014-11-06 DIAGNOSIS — S62112G Displaced fracture of triquetrum [cuneiform] bone, left wrist, subsequent encounter for fracture with delayed healing: Secondary | ICD-10-CM

## 2014-11-06 NOTE — Progress Notes (Signed)
Mitchell Ellis is a 19 y.o. male who presents to [facility] Medicine today for follow-up left wrist triquetrum fracture. Patient has a long history of a wrist injury resulting from a slide into face while playing baseball. Initially x-rays were negative. He was thought to have a strain versus sprain versus TFCC tear. Eventually MRI showed a triquetrum fracture. He was casted and subsequently lost to follow-up in [place]. He returns 6 weeks after the initial cast was placed. The cast was ill fitting and was removed. X-ray at that time showed a nonunion versus delayed healing of a triquetrum fracture. In the interim he was placed into an Exos cast. He has been in the cast now for 2 weeks. He notes some mild ulnar wrist pain.  Past Medical History  Diagnosis Date  . Epilepsy (HCC)    No past surgical history on file. Social History  Substance Use Topics  . Smoking status: Never Smoker   . Smokeless tobacco: Never Used  . Alcohol Use: No   ROS as above Medications: Current Outpatient Prescriptions  Medication Sig Dispense Refill  . cetirizine (ZYRTEC) 10 MG tablet Take 10 mg by mouth daily.    Marland Kitchen levETIRAcetam (KEPPRA) 500 MG tablet Take 500 mg by mouth 2 (two) times daily.     No current facility-administered medications for this visit.   Allergies  Allergen Reactions  . Sulfonamide Derivatives     REACTION: Rash     Exam:  BP 111/61 mmHg  Pulse 62  Temp(Src) 97.7 F (36.5 C) (Oral)  Resp 16  SpO2 99% Gen: Well NAD  Left Wrist normal-appearing. Minimally tender overlying the ulnar hand.   No results found for this or any previous visit (from the past 24 hour(s)). Dg Wrist Complete Left  11/06/2014   CLINICAL DATA:  Prior fracture of the triquetrum bone.  EXAM: LEFT WRIST - COMPLETE 3+ VIEW  COMPARISON:  October 23, 2014 and left wrist MRI September 07, 2014  FINDINGS: Frontal, oblique lateral, and ulnar deviation scaphoid images were obtained. There is sclerotic change in the  proximal triquetrum bone, stable. A subtle lucency in the mid triquetrum bone remains without change. No new fracture. No dislocation. Joint spaces appear intact.  IMPRESSION: No appreciable change from recent prior study. Sclerotic focus in the triquetrum bone is noted with subtle linear lucency. These are stable changes. The possibility of early avascular necrosis in the proximal triquetrum bone must be of concern. No new fracture. No dislocation.   Electronically Signed   By: Bretta Bang III M.D.   On: 11/06/2014 09:57    Assessment and Plan: 19 y.o. male with triquetrum injury.  Obviously this is concerning. Patient is not doing well despite immobilization. We'll refer to hand surgery for second opinion about care plan.  Discussed warning signs or symptoms. Please see discharge instructions. Patient expresses understanding.

## 2014-11-06 NOTE — Patient Instructions (Signed)
Thank you for coming in today. Return in 2 weeks.   

## 2014-11-20 ENCOUNTER — Ambulatory Visit: Payer: BLUE CROSS/BLUE SHIELD | Admitting: Family Medicine

## 2015-01-12 ENCOUNTER — Other Ambulatory Visit: Payer: Self-pay | Admitting: Orthopedic Surgery

## 2015-01-12 DIAGNOSIS — M25532 Pain in left wrist: Secondary | ICD-10-CM

## 2015-01-26 ENCOUNTER — Ambulatory Visit
Admission: RE | Admit: 2015-01-26 | Discharge: 2015-01-26 | Disposition: A | Discharge status: Home / Self Care | Payer: BLUE CROSS/BLUE SHIELD | Source: Ambulatory Visit | Attending: Orthopedic Surgery | Admitting: Orthopedic Surgery

## 2015-01-26 DIAGNOSIS — M25532 Pain in left wrist: Secondary | ICD-10-CM

## 2015-01-26 MED ORDER — IOHEXOL 180 MG/ML  SOLN
3.0000 mL | Freq: Once | INTRAMUSCULAR | Status: AC | PRN
  Administered 2015-01-26: 3 mL via INTRA_ARTICULAR

## 2016-05-21 IMAGING — XA DG FLUORO GUIDE NDL PLC/BX
1 series · 5 of 5 positions shown · non-contrast
Comparison: none

CLINICAL DATA: Pain and limited range of motion.

[Series 1: ortho standard · 5 of 5 slices shown]
[im 1/5]
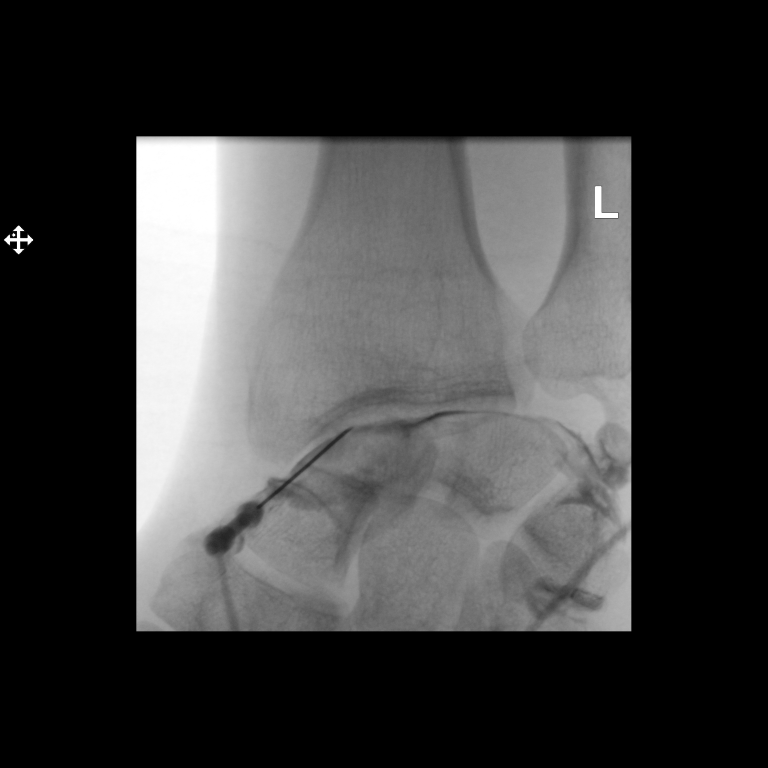
[im 2/5]
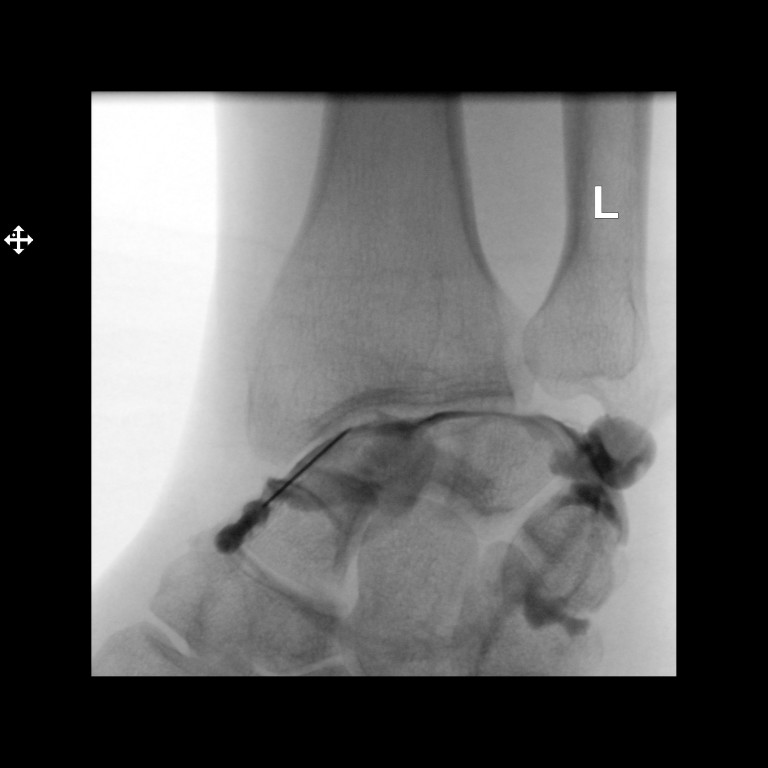
[im 3/5]
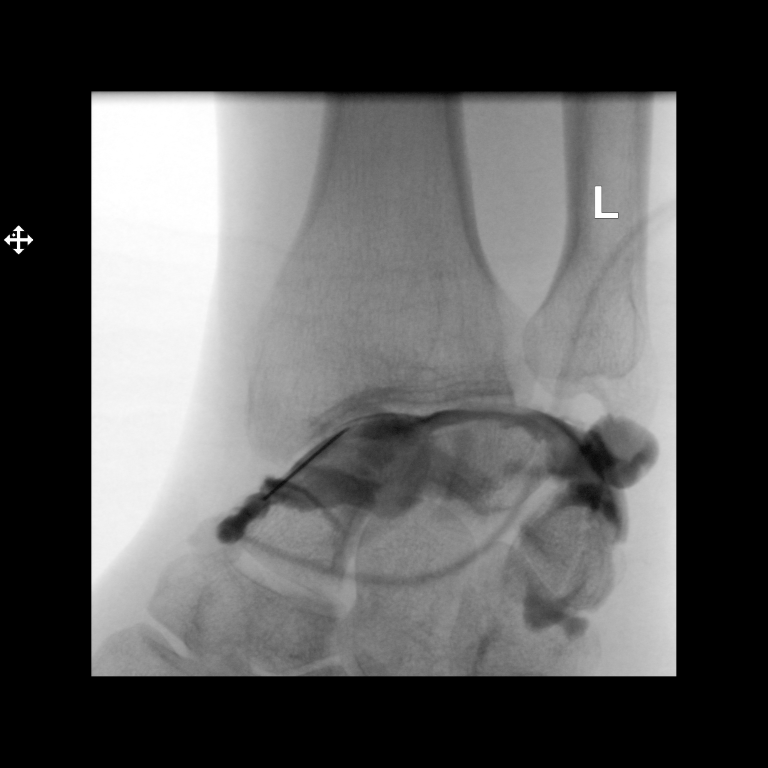
[im 4/5]
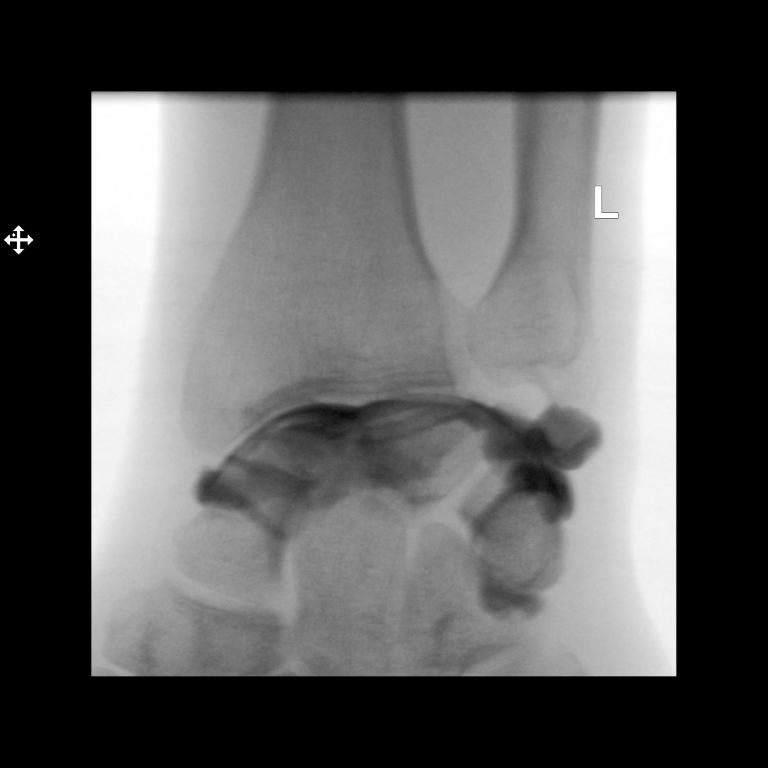
[im 5/5]
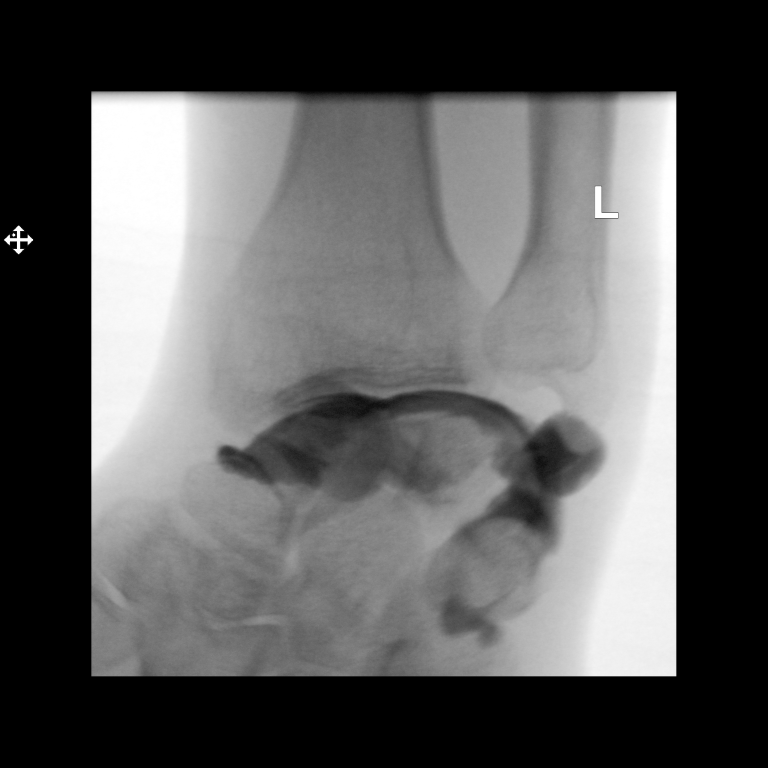

[5 of 5 positions shown; findings below may reference images not displayed]

FLUOROSCOPY TIME:  0 minutes 41 seconds. 5.6 micro gray meter
squared

PROCEDURE:
Left RADIOCARPAL JOINT INJECTION UNDER FLUOROSCOPY

The skin dorsal to the wrist was scrubbed with Betadine and draped
in sterile fashion. Skin anesthesia was carried out using!%
Lidocaine. A 25 Yevgeniy needle was directed into the radiocarpal joint
under flouroscopic guidance. 3cc of a mixture of 0.1 ml Multihance
and 10 ml of dilute Isovue 200 was then used to fill the radiocarpal
joint. Filming showed normal contrast distribution.
IMPRESSION: Technically successful left radiocarpal joint injection for MRI.
